# Patient Record
Sex: Female | Born: 1950 | Race: White | Hispanic: No | Marital: Married | Smoking: Current every day smoker
Health system: Southern US, Community
[De-identification: ages and names within clinical notes are randomized; demographics above are authoritative.]

## PROBLEM LIST (undated history)

## (undated) HISTORY — PX: MASTECTOMY: SHX3

---
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
## 1973-08-14 HISTORY — PX: CHOLECYSTECTOMY: SHX55

## 1994-08-14 HISTORY — PX: CARPAL TUNNEL RELEASE: SHX101

## 1999-05-12 ENCOUNTER — Other Ambulatory Visit: Admission: RE | Admit: 1999-05-12 | Discharge: 1999-05-12 | Payer: Self-pay | Admitting: *Deleted

## 1999-05-12 ENCOUNTER — Encounter (INDEPENDENT_AMBULATORY_CARE_PROVIDER_SITE_OTHER): Payer: Self-pay

## 1999-06-24 ENCOUNTER — Other Ambulatory Visit: Admission: RE | Admit: 1999-06-24 | Discharge: 1999-06-24 | Payer: Self-pay | Admitting: *Deleted

## 1999-07-25 ENCOUNTER — Other Ambulatory Visit: Admission: RE | Admit: 1999-07-25 | Discharge: 1999-07-25 | Payer: Self-pay | Admitting: *Deleted

## 1999-07-25 ENCOUNTER — Encounter (INDEPENDENT_AMBULATORY_CARE_PROVIDER_SITE_OTHER): Payer: Self-pay | Admitting: Specialist

## 2001-10-30 ENCOUNTER — Other Ambulatory Visit: Admission: RE | Admit: 2001-10-30 | Discharge: 2001-10-30 | Payer: Self-pay | Admitting: *Deleted

## 2003-11-27 ENCOUNTER — Encounter: Admission: RE | Admit: 2003-11-27 | Discharge: 2003-11-27 | Payer: Self-pay | Admitting: Family Medicine

## 2008-12-30 ENCOUNTER — Other Ambulatory Visit: Admission: RE | Admit: 2008-12-30 | Discharge: 2008-12-30 | Payer: Self-pay | Admitting: Family Medicine

## 2013-06-01 ENCOUNTER — Emergency Department (INDEPENDENT_AMBULATORY_CARE_PROVIDER_SITE_OTHER)
Admission: EM | Admit: 2013-06-01 | Discharge: 2013-06-01 | Disposition: A | Discharge status: Home / Self Care | Payer: Federal, State, Local not specified - PPO | Source: Home / Self Care | Attending: Emergency Medicine | Admitting: Emergency Medicine

## 2013-06-01 ENCOUNTER — Encounter (HOSPITAL_COMMUNITY): Payer: Self-pay | Admitting: Emergency Medicine

## 2013-06-01 ENCOUNTER — Emergency Department (INDEPENDENT_AMBULATORY_CARE_PROVIDER_SITE_OTHER): Payer: Federal, State, Local not specified - PPO

## 2013-06-01 DIAGNOSIS — S82843A Displaced bimalleolar fracture of unspecified lower leg, initial encounter for closed fracture: Secondary | ICD-10-CM

## 2013-06-01 DIAGNOSIS — S82841A Displaced bimalleolar fracture of right lower leg, initial encounter for closed fracture: Secondary | ICD-10-CM

## 2013-06-01 MED ORDER — HYDROCODONE-ACETAMINOPHEN 5-325 MG PO TABS
1.0000 | ORAL_TABLET | Freq: Once | ORAL | Status: AC
  Administered 2013-06-01: 1 via ORAL

## 2013-06-01 MED ORDER — HYDROCODONE-ACETAMINOPHEN 5-325 MG PO TABS
ORAL_TABLET | ORAL | Status: AC
  Filled 2013-06-01: qty 1

## 2013-06-01 MED ORDER — HYDROCODONE-ACETAMINOPHEN 5-325 MG PO TABS: 1.0000 | ORAL_TABLET | ORAL | Status: DC | PRN

## 2013-06-01 NOTE — Progress Notes (Signed)
Orthopedic Tech Progress Note Patient Details:  Shari Frederick [birth year] [id number]  Ortho Devices Type of Ortho Device: Ace wrap;Stirrup splint;Post (short leg) splint Ortho Device/Splint Location: RLE Ortho Device/Splint Interventions: Ordered;Application   Jennye Moccasin 06/01/2013, 8:54 PM

## 2013-06-01 NOTE — ED Notes (Addendum)
Patient complains of right ankle pain after tripping over a root about an hour ago. States she is unable to put weight on right foot.

## 2013-06-01 NOTE — ED Provider Notes (Signed)
CSN: [id number]     Arrival date & time 06/01/13  1819 History   First MD Initiated Contact with Patient 06/01/13 1855     Chief Complaint  Patient presents with  . Ankle Pain   (Consider location/radiation/quality/duration/timing/severity/associated sxs/prior Treatment) HPI Comments: 62 year old female presents complaining of ankle pain after sustaining an injury to her right ankle. She was walking in her yard when she tripped and fell. She heard a loud pop in her ankle and had immediate pain and swelling in the ankle. Now, she has pain and swelling in the lateral ankle in has not been able to bear weight since this happened. She denies any numbness in the foot distal to this and denies any other injuries.   History reviewed. No pertinent past medical history. Past Surgical History  Procedure Laterality Date  . Mastectomy Bilateral    No family history on file. History  Substance Use Topics  . Smoking status: Never Smoker   . Smokeless tobacco: Not on file  . Alcohol Use: No   OB History   Grav Para Term Preterm Abortions TAB SAB Ect Mult Living                 Review of Systems  Constitutional: Negative for fever and chills.  Eyes: Negative for visual disturbance.  Respiratory: Negative for cough and shortness of breath.   Cardiovascular: Negative for chest pain, palpitations and leg swelling.  Gastrointestinal: Negative for nausea, vomiting and abdominal pain.  Endocrine: Negative for polydipsia and polyuria.  Genitourinary: Negative for dysuria, urgency and frequency.  Musculoskeletal: Positive for arthralgias.       See history of present illness  Skin: Negative for rash.  Neurological: Negative for dizziness, weakness and light-headedness.    Allergies  Review of patient's allergies indicates no known allergies.  Home Medications   Current Outpatient Rx  Name  Route  Sig  Dispense  Refill  . escitalopram (LEXAPRO) 20 MG tablet   Oral   Take 40 mg by mouth  daily.         Marland Kitchen HYDROcodone-acetaminophen (NORCO/VICODIN) 5-325 MG per tablet   Oral   Take 1 tablet by mouth every 4 (four) hours as needed for pain.   20 tablet   0    BP 126/76  Pulse 99  Temp(Src) 98.2 F (36.8 C) (Oral)  Resp 18  SpO2 94% Physical Exam  Nursing note and vitals reviewed. Constitutional: She is oriented to person, place, and time. Vital signs are normal. She appears well-developed and well-nourished. No distress.  HENT:  Head: Normocephalic and atraumatic.  Pulmonary/Chest: Effort normal. No respiratory distress.  Musculoskeletal:       Right ankle: She exhibits decreased range of motion, swelling (over lateral malleolus) and ecchymosis. She exhibits no deformity. Tenderness. Lateral malleolus and medial malleolus tenderness found. No AITFL, no CF ligament, no posterior TFL, no head of 5th metatarsal and no proximal fibula tenderness found. Achilles tendon normal.  Neurological: She is alert and oriented to person, place, and time. She has normal strength. Coordination normal.  Skin: Skin is warm and dry. No rash noted. She is not diaphoretic.  Psychiatric: She has a normal mood and affect. Judgment normal.    ED Course  Procedures (including critical care time) Labs Review Labs Reviewed - No data to display Imaging Review Dg Ankle Complete Right  06/01/2013   CLINICAL DATA:  Ankle pain, swelling.  Fall.  EXAM: RIGHT ANKLE - COMPLETE 3+ VIEW  COMPARISON:  None.  FINDINGS: Fractures are noted through the medial and lateral malleoli. Diffuse soft tissue swelling. Fractures are minimally displaced. Ankle mortise appears intact.  IMPRESSION: Medial and lateral malleolar fractures.   Electronically Signed   By: Charlett Nose M.D.   On: 06/01/2013 20:10      MDM   1. Bimalleolar fracture, right, closed, initial encounter    Discussed with Dr. Eulah Pont, orthopedic surgeon on call. We will place the patient in a posterior leg splint, nonweightbearing, and she  will followup with his office tomorrow morning.  Meds ordered this encounter  Medications  . HYDROcodone-acetaminophen (NORCO/VICODIN) 5-325 MG per tablet    Sig: Take 1 tablet by mouth every 4 (four) hours as needed for pain.    Dispense:  20 tablet    Refill:  0    Order Specific Question:  Supervising Provider    Answer:  Lorenz Coaster, DAVID C [id number]  . HYDROcodone-acetaminophen (NORCO/VICODIN) 5-325 MG per tablet 1 tablet    Sig:        Graylon Good, PA-C 06/01/13 2036

## 2013-06-01 NOTE — ED Provider Notes (Signed)
Medical screening examination/treatment/procedure(s) were performed by non-physician practitioner and as supervising physician I was immediately available for consultation/collaboration.  Kohei Antonellis, M.D.  Dunia Pringle C Jerami Tammen, MD 06/01/13 2156 

## 2014-10-06 DIAGNOSIS — M810 Age-related osteoporosis without current pathological fracture: Secondary | ICD-10-CM | POA: Diagnosis not present

## 2014-10-06 DIAGNOSIS — Z23 Encounter for immunization: Secondary | ICD-10-CM | POA: Diagnosis not present

## 2014-10-06 DIAGNOSIS — F419 Anxiety disorder, unspecified: Secondary | ICD-10-CM | POA: Diagnosis not present

## 2014-10-06 DIAGNOSIS — F17210 Nicotine dependence, cigarettes, uncomplicated: Secondary | ICD-10-CM | POA: Diagnosis not present

## 2014-10-06 DIAGNOSIS — Z853 Personal history of malignant neoplasm of breast: Secondary | ICD-10-CM | POA: Diagnosis not present

## 2014-10-06 DIAGNOSIS — E559 Vitamin D deficiency, unspecified: Secondary | ICD-10-CM | POA: Diagnosis not present

## 2015-01-22 DIAGNOSIS — H0015 Chalazion left lower eyelid: Secondary | ICD-10-CM | POA: Diagnosis not present

## 2015-01-28 DIAGNOSIS — H0015 Chalazion left lower eyelid: Secondary | ICD-10-CM | POA: Diagnosis not present

## 2015-07-01 DIAGNOSIS — Z23 Encounter for immunization: Secondary | ICD-10-CM | POA: Diagnosis not present

## 2016-01-07 ENCOUNTER — Encounter (HOSPITAL_COMMUNITY): Payer: Self-pay | Admitting: Emergency Medicine

## 2016-01-07 ENCOUNTER — Ambulatory Visit (HOSPITAL_COMMUNITY)
Admission: EM | Admit: 2016-01-07 | Discharge: 2016-01-07 | Disposition: A | Discharge status: Home / Self Care | Payer: Medicare Other | Attending: Emergency Medicine | Admitting: Emergency Medicine

## 2016-01-07 ENCOUNTER — Ambulatory Visit (INDEPENDENT_AMBULATORY_CARE_PROVIDER_SITE_OTHER): Payer: Medicare Other

## 2016-01-07 DIAGNOSIS — S8265XA Nondisplaced fracture of lateral malleolus of left fibula, initial encounter for closed fracture: Secondary | ICD-10-CM | POA: Diagnosis not present

## 2016-01-07 DIAGNOSIS — S8262XA Displaced fracture of lateral malleolus of left fibula, initial encounter for closed fracture: Secondary | ICD-10-CM

## 2016-01-07 MED ORDER — HYDROCODONE-ACETAMINOPHEN 5-325 MG PO TABS: 1.0000 | ORAL_TABLET | Freq: Four times a day (QID) | ORAL | Status: DC | PRN

## 2016-01-07 NOTE — Discharge Instructions (Signed)
You have a transverse, nondisplaced fracture of your lateral malleolus. This will heal well without surgery. You will need to wear the Cam Walker boot for 4-6 weeks. Use the crutches if you have pain with walking. You'll need a follow-up x-ray in 10-14 days to make sure the bones are still aligned appropriately. If your primary care doctor is comfortable following this, you can follow-up with her. Otherwise, follow-up with Dr. Thurston HoleWainer.

## 2016-01-07 NOTE — ED Notes (Signed)
Pt reports she twisted left ankle yest night while walking her dog Reports dog pulled her while chasing another animal Brought back in wheel chair.... Sx include swelling and pain when bearing wt A&O x4... No acute distress.

## 2016-01-07 NOTE — ED Provider Notes (Signed)
CSN: [id number]     Arrival date & time 01/07/16  1309 History   First MD Initiated Contact with Patient 01/07/16 1327     Chief Complaint  Patient presents with  . Ankle Injury   (Consider location/radiation/quality/duration/timing/severity/associated sxs/prior Treatment) HPI  He is a 65 year old woman here for evaluation of left ankle injury. She states last night around 6 PM she was walking her dog when the dog visit unexpectedly causing her to roll her ankle. She was initially able to walk on it. She states that last night she kept it propped up, wrapped, and iced.  When she woke up this morning, the lateral ankle was swollen and she has had extreme difficulty bearing weight today.  History reviewed. No pertinent past medical history. Past Surgical History  Procedure Laterality Date  . Mastectomy Bilateral    No family history on file. Social History  Substance Use Topics  . Smoking status: Never Smoker   . Smokeless tobacco: None  . Alcohol Use: No   OB History    No data available     Review of Systems As in history of present illness Allergies  Review of patient's allergies indicates no known allergies.  Home Medications   Prior to Admission medications   Medication Sig Start Date End Date Taking? Authorizing Provider  escitalopram (LEXAPRO) 20 MG tablet Take 40 mg by mouth daily.   Yes Historical Provider, MD  HYDROcodone-acetaminophen (NORCO) 5-325 MG tablet Take 1 tablet by mouth every 6 (six) hours as needed for moderate pain. 01/07/16   Charm RingsErin J Christell Steinmiller, MD   Meds Ordered and Administered this Visit  Medications - No data to display  BP 154/80 mmHg  Pulse 104  Temp(Src) 98.1 F (36.7 C) (Oral)  Resp 16  SpO2 95% No data found.   Physical Exam  Constitutional: She is oriented to person, place, and time. She appears well-developed and well-nourished. No distress.  Cardiovascular: Normal rate.   Pulmonary/Chest: Effort normal.  Musculoskeletal:  Left ankle:  No erythema or bruising. She does have moderate swelling around the lateral malleolus. She is tender along the anterior portion of the lateral malleolus. 5 out of 5 strength in the ankle. 2+ DP pulse. No tenderness of the fifth metatarsal. No medial malleolar tenderness.  Neurological: She is alert and oriented to person, place, and time.    ED Course  Procedures (including critical care time)  Labs Review Labs Reviewed - No data to display  Imaging Review Dg Ankle Complete Left  01/07/2016  CLINICAL DATA:  Walking dog last night, twisted ankle EXAM: LEFT ANKLE COMPLETE - 3+ VIEW COMPARISON:  None. FINDINGS: There is an acute transverse nondisplaced fracture of the left lateral malleolus. There is no other fracture or dislocation. There is a plantar calcaneal spur. There is no evidence of arthropathy or other focal bone abnormality. Soft tissues are unremarkable. IMPRESSION: 1. Acute transverse nondisplaced fracture of the left lateral malleolus. Electronically Signed   By: Elige KoHetal  Patel   On: 01/07/2016 13:50     MDM   1. Lateral malleolar fracture, left, closed, initial encounter    Fracture appears to be very stable. We'll place in SYSCOCam Walker boot. She does have crutches at home that she can use. Prescription given for hydrocodone to use as needed for pain. Discussed that if her primary care doctor is comfortable following this, they can follow-up with her. Otherwise, they will need to follow-up with Dr. Thurston HoleWainer who did her prior ankle surgery. Will need  follow-up x-ray in 10-14 days.     Charm Rings, MD 01/07/16 [phone]

## 2016-04-11 DIAGNOSIS — F419 Anxiety disorder, unspecified: Secondary | ICD-10-CM | POA: Diagnosis not present

## 2016-04-11 DIAGNOSIS — M810 Age-related osteoporosis without current pathological fracture: Secondary | ICD-10-CM | POA: Diagnosis not present

## 2016-04-11 DIAGNOSIS — F17210 Nicotine dependence, cigarettes, uncomplicated: Secondary | ICD-10-CM | POA: Diagnosis not present

## 2016-04-11 DIAGNOSIS — Z853 Personal history of malignant neoplasm of breast: Secondary | ICD-10-CM | POA: Diagnosis not present

## 2016-04-11 DIAGNOSIS — E559 Vitamin D deficiency, unspecified: Secondary | ICD-10-CM | POA: Diagnosis not present

## 2016-04-11 DIAGNOSIS — Z23 Encounter for immunization: Secondary | ICD-10-CM | POA: Diagnosis not present

## 2016-12-04 DIAGNOSIS — F419 Anxiety disorder, unspecified: Secondary | ICD-10-CM | POA: Diagnosis not present

## 2016-12-04 DIAGNOSIS — E559 Vitamin D deficiency, unspecified: Secondary | ICD-10-CM | POA: Diagnosis not present

## 2016-12-04 DIAGNOSIS — M810 Age-related osteoporosis without current pathological fracture: Secondary | ICD-10-CM | POA: Diagnosis not present

## 2016-12-04 DIAGNOSIS — Z853 Personal history of malignant neoplasm of breast: Secondary | ICD-10-CM | POA: Diagnosis not present

## 2016-12-04 DIAGNOSIS — Z1211 Encounter for screening for malignant neoplasm of colon: Secondary | ICD-10-CM | POA: Diagnosis not present

## 2016-12-04 DIAGNOSIS — Z1159 Encounter for screening for other viral diseases: Secondary | ICD-10-CM | POA: Diagnosis not present

## 2016-12-04 DIAGNOSIS — Z0000 Encounter for general adult medical examination without abnormal findings: Secondary | ICD-10-CM | POA: Diagnosis not present

## 2016-12-04 DIAGNOSIS — F17210 Nicotine dependence, cigarettes, uncomplicated: Secondary | ICD-10-CM | POA: Diagnosis not present

## 2017-07-11 IMAGING — DX DG ANKLE COMPLETE 3+V*L*
3 series · 3 of 3 positions shown · non-contrast
Comparison: None.

CLINICAL DATA: Walking dog last night, twisted ankle

EXAM:
LEFT ANKLE COMPLETE - 3+ VIEW

[ankle ap]
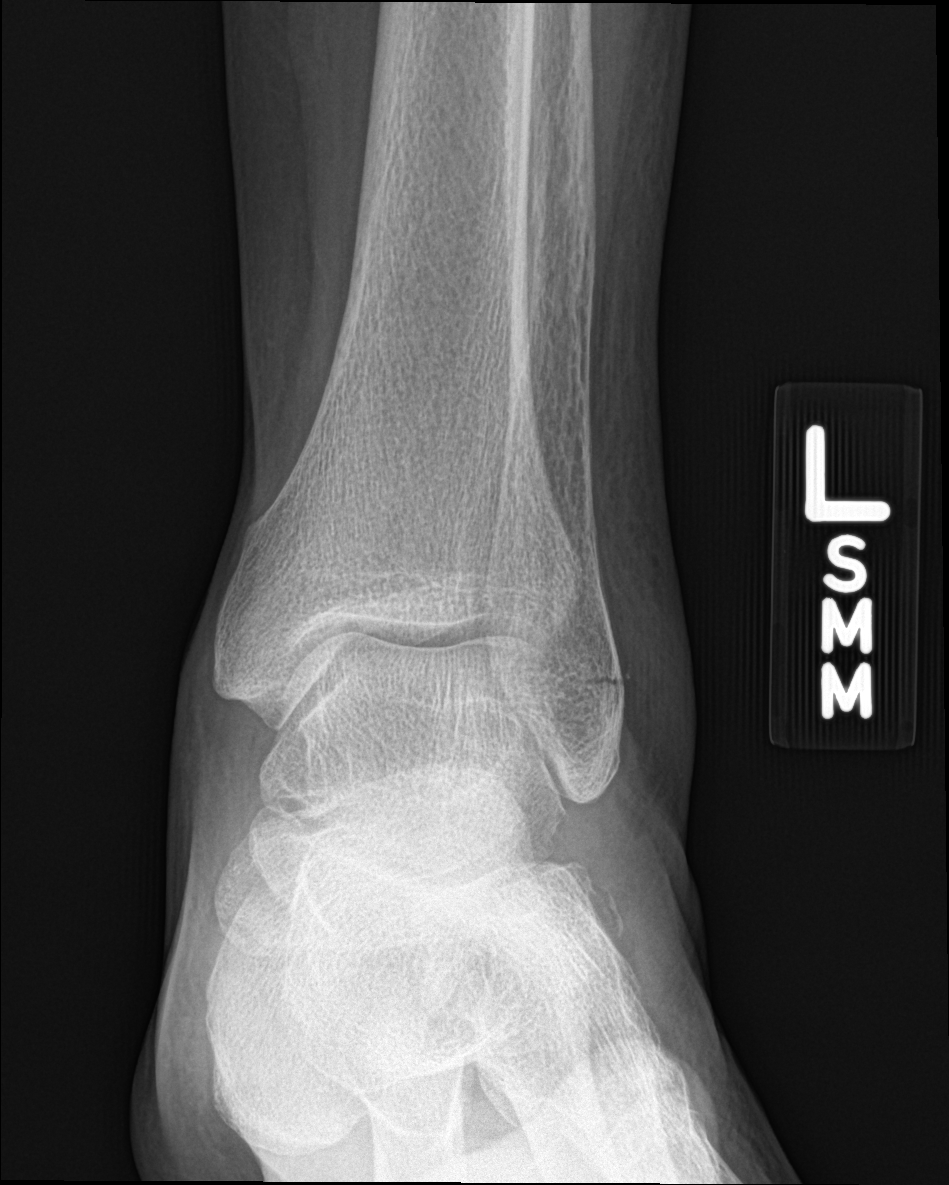

[ankle obl]
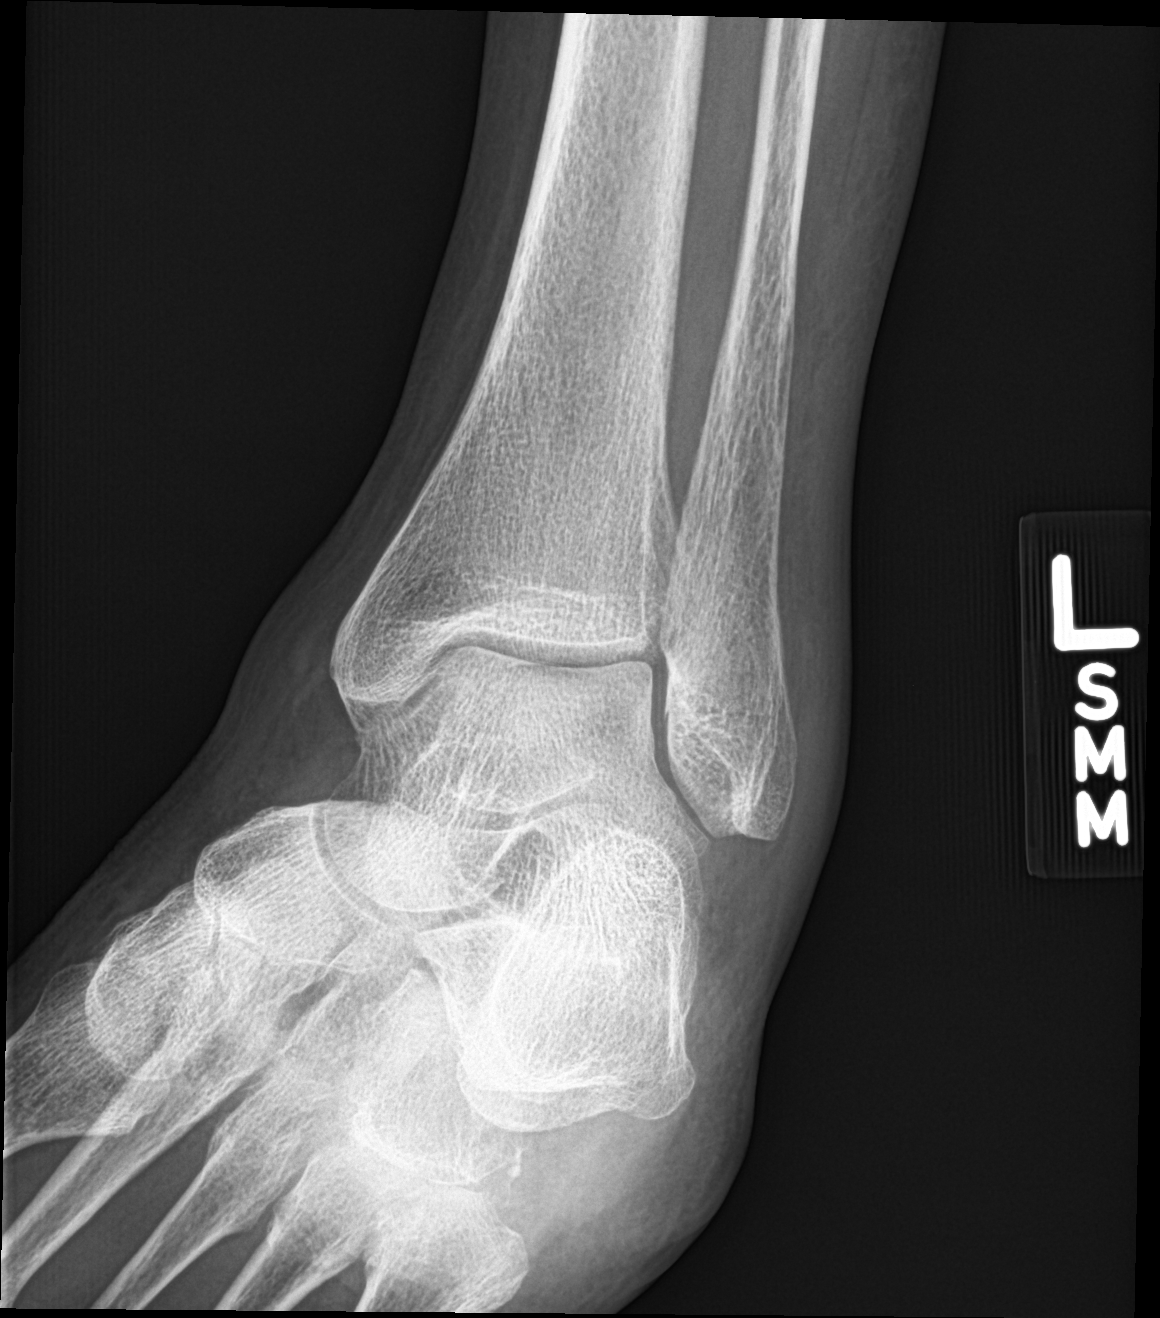

[ankle lat]
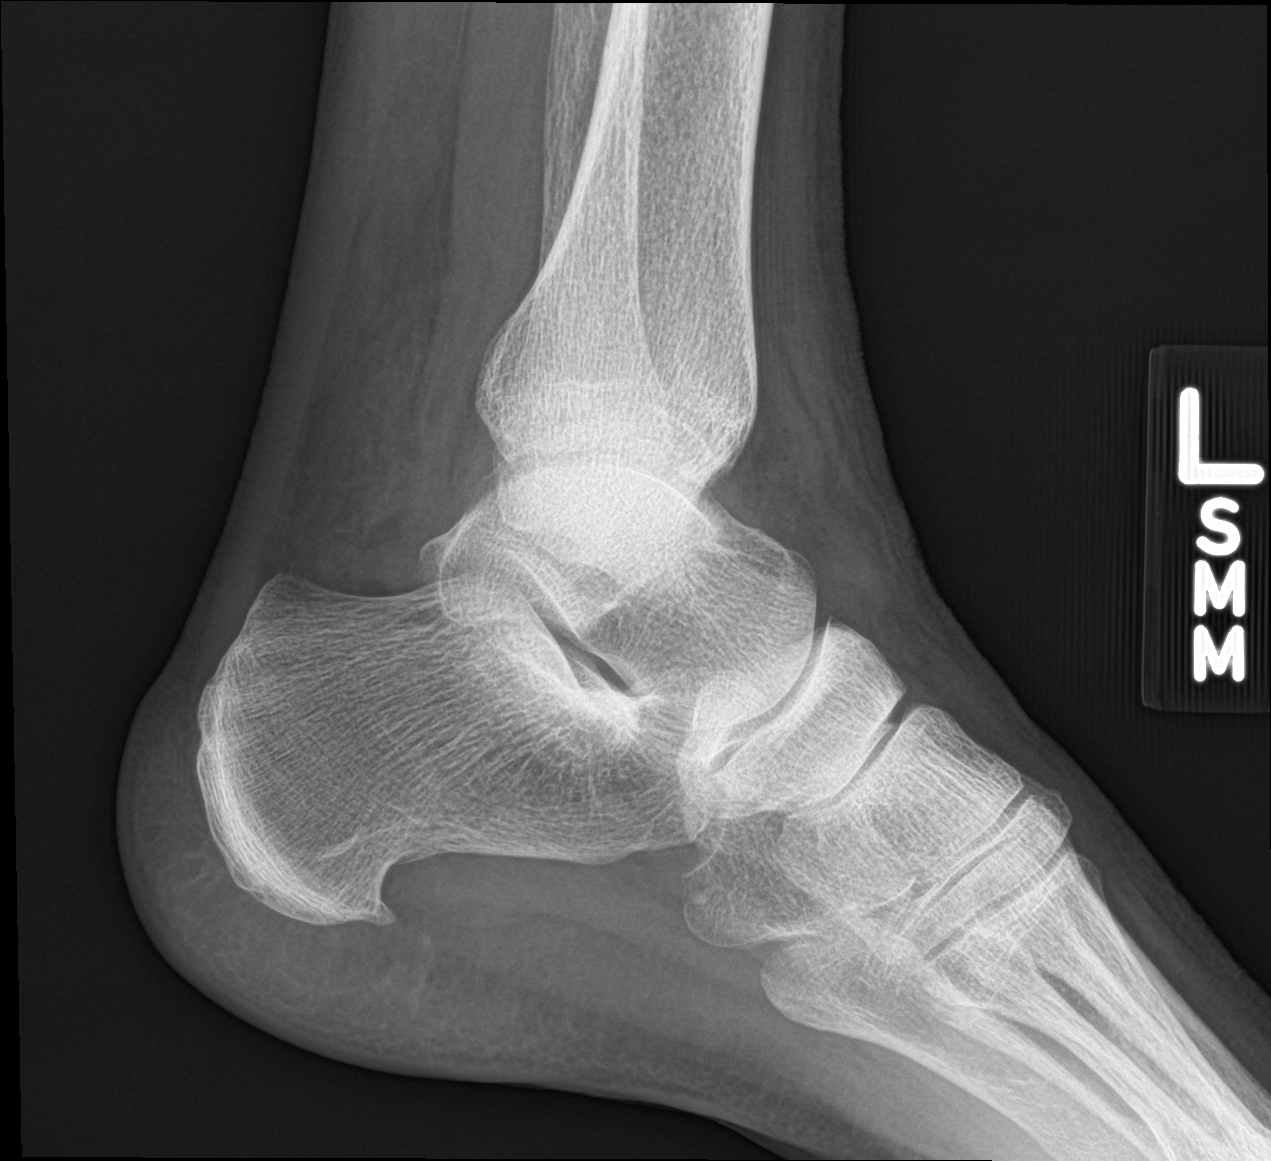

[3 of 3 positions shown; findings below may reference images not displayed]

FINDINGS: There is an acute transverse nondisplaced fracture of the left
lateral malleolus. There is no other fracture or dislocation. There
is a plantar calcaneal spur. There is no evidence of arthropathy or
other focal bone abnormality. Soft tissues are unremarkable.
IMPRESSION: 1. Acute transverse nondisplaced fracture of the left lateral
malleolus.

## 2017-11-23 DIAGNOSIS — M810 Age-related osteoporosis without current pathological fracture: Secondary | ICD-10-CM | POA: Diagnosis not present

## 2017-11-23 DIAGNOSIS — F411 Generalized anxiety disorder: Secondary | ICD-10-CM | POA: Diagnosis not present

## 2017-11-23 DIAGNOSIS — Z6822 Body mass index (BMI) 22.0-22.9, adult: Secondary | ICD-10-CM | POA: Diagnosis not present

## 2017-11-23 DIAGNOSIS — F17210 Nicotine dependence, cigarettes, uncomplicated: Secondary | ICD-10-CM | POA: Diagnosis not present

## 2019-07-04 DIAGNOSIS — F17210 Nicotine dependence, cigarettes, uncomplicated: Secondary | ICD-10-CM | POA: Diagnosis not present

## 2019-07-04 DIAGNOSIS — F411 Generalized anxiety disorder: Secondary | ICD-10-CM | POA: Diagnosis not present

## 2019-07-04 DIAGNOSIS — Z0000 Encounter for general adult medical examination without abnormal findings: Secondary | ICD-10-CM | POA: Diagnosis not present

## 2019-07-04 DIAGNOSIS — E559 Vitamin D deficiency, unspecified: Secondary | ICD-10-CM | POA: Diagnosis not present

## 2019-07-04 DIAGNOSIS — Z853 Personal history of malignant neoplasm of breast: Secondary | ICD-10-CM | POA: Diagnosis not present

## 2019-07-04 DIAGNOSIS — M810 Age-related osteoporosis without current pathological fracture: Secondary | ICD-10-CM | POA: Diagnosis not present

## 2020-08-24 DIAGNOSIS — E559 Vitamin D deficiency, unspecified: Secondary | ICD-10-CM | POA: Diagnosis not present

## 2020-08-24 DIAGNOSIS — F411 Generalized anxiety disorder: Secondary | ICD-10-CM | POA: Diagnosis not present

## 2020-08-24 DIAGNOSIS — Z6823 Body mass index (BMI) 23.0-23.9, adult: Secondary | ICD-10-CM | POA: Diagnosis not present

## 2020-08-24 DIAGNOSIS — R202 Paresthesia of skin: Secondary | ICD-10-CM | POA: Diagnosis not present

## 2020-08-24 DIAGNOSIS — F17210 Nicotine dependence, cigarettes, uncomplicated: Secondary | ICD-10-CM | POA: Diagnosis not present

## 2021-08-26 DIAGNOSIS — R202 Paresthesia of skin: Secondary | ICD-10-CM | POA: Diagnosis not present

## 2021-08-26 DIAGNOSIS — F411 Generalized anxiety disorder: Secondary | ICD-10-CM | POA: Diagnosis not present

## 2021-08-26 DIAGNOSIS — E559 Vitamin D deficiency, unspecified: Secondary | ICD-10-CM | POA: Diagnosis not present

## 2021-08-26 DIAGNOSIS — F17210 Nicotine dependence, cigarettes, uncomplicated: Secondary | ICD-10-CM | POA: Diagnosis not present

## 2022-10-25 DIAGNOSIS — F17210 Nicotine dependence, cigarettes, uncomplicated: Secondary | ICD-10-CM | POA: Diagnosis not present

## 2022-10-25 DIAGNOSIS — Z853 Personal history of malignant neoplasm of breast: Secondary | ICD-10-CM | POA: Diagnosis not present

## 2022-10-25 DIAGNOSIS — Z23 Encounter for immunization: Secondary | ICD-10-CM | POA: Diagnosis not present

## 2022-10-25 DIAGNOSIS — E785 Hyperlipidemia, unspecified: Secondary | ICD-10-CM | POA: Diagnosis not present

## 2022-10-25 DIAGNOSIS — R202 Paresthesia of skin: Secondary | ICD-10-CM | POA: Diagnosis not present

## 2022-10-25 DIAGNOSIS — M810 Age-related osteoporosis without current pathological fracture: Secondary | ICD-10-CM | POA: Diagnosis not present

## 2022-10-25 DIAGNOSIS — E559 Vitamin D deficiency, unspecified: Secondary | ICD-10-CM | POA: Diagnosis not present

## 2022-10-25 DIAGNOSIS — F411 Generalized anxiety disorder: Secondary | ICD-10-CM | POA: Diagnosis not present

## 2022-10-25 DIAGNOSIS — J449 Chronic obstructive pulmonary disease, unspecified: Secondary | ICD-10-CM | POA: Diagnosis not present

## 2024-02-28 DIAGNOSIS — F17210 Nicotine dependence, cigarettes, uncomplicated: Secondary | ICD-10-CM | POA: Diagnosis not present

## 2024-02-28 DIAGNOSIS — E559 Vitamin D deficiency, unspecified: Secondary | ICD-10-CM | POA: Diagnosis not present

## 2024-02-28 DIAGNOSIS — Z6821 Body mass index (BMI) 21.0-21.9, adult: Secondary | ICD-10-CM | POA: Diagnosis not present

## 2024-02-28 DIAGNOSIS — F411 Generalized anxiety disorder: Secondary | ICD-10-CM | POA: Diagnosis not present

## 2024-02-28 DIAGNOSIS — Z13220 Encounter for screening for lipoid disorders: Secondary | ICD-10-CM | POA: Diagnosis not present

## 2024-02-28 DIAGNOSIS — Z136 Encounter for screening for cardiovascular disorders: Secondary | ICD-10-CM | POA: Diagnosis not present

## 2024-02-28 DIAGNOSIS — R7989 Other specified abnormal findings of blood chemistry: Secondary | ICD-10-CM | POA: Diagnosis not present

## 2024-02-28 DIAGNOSIS — G629 Polyneuropathy, unspecified: Secondary | ICD-10-CM | POA: Diagnosis not present

## 2024-02-28 DIAGNOSIS — D751 Secondary polycythemia: Secondary | ICD-10-CM | POA: Diagnosis not present

## 2024-05-09 DIAGNOSIS — D751 Secondary polycythemia: Secondary | ICD-10-CM | POA: Diagnosis not present

## 2024-05-09 DIAGNOSIS — G629 Polyneuropathy, unspecified: Secondary | ICD-10-CM | POA: Diagnosis not present

## 2024-05-09 DIAGNOSIS — G479 Sleep disorder, unspecified: Secondary | ICD-10-CM | POA: Diagnosis not present

## 2024-05-09 DIAGNOSIS — Z23 Encounter for immunization: Secondary | ICD-10-CM | POA: Diagnosis not present

## 2024-05-09 DIAGNOSIS — F411 Generalized anxiety disorder: Secondary | ICD-10-CM | POA: Diagnosis not present

## 2024-05-09 DIAGNOSIS — Z6821 Body mass index (BMI) 21.0-21.9, adult: Secondary | ICD-10-CM | POA: Diagnosis not present

## 2024-05-09 DIAGNOSIS — M545 Low back pain, unspecified: Secondary | ICD-10-CM | POA: Diagnosis not present

## 2024-07-01 ENCOUNTER — Encounter: Payer: Self-pay | Admitting: Diagnostic Neuroimaging

## 2024-07-01 ENCOUNTER — Ambulatory Visit (INDEPENDENT_AMBULATORY_CARE_PROVIDER_SITE_OTHER): Admitting: Diagnostic Neuroimaging

## 2024-07-01 VITALS — BP 142/74 | HR 87 | Ht 62.0 in | Wt 113.0 lb

## 2024-07-01 DIAGNOSIS — G5793 Unspecified mononeuropathy of bilateral lower limbs: Secondary | ICD-10-CM | POA: Diagnosis not present

## 2024-07-01 MED ORDER — GABAPENTIN 300 MG PO CAPS: 300.0000 mg | ORAL_CAPSULE | Freq: Three times a day (TID) | ORAL | 6 refills | Status: AC

## 2024-07-01 NOTE — Patient Instructions (Signed)
  NEUROPATHY PAIN (feet, toes; since ~2023) - check neuropathy labs; check vascular arterial ultrasound - increase gabapentin up to 300mg  three times a day - caution with balance, walking, driving

## 2024-07-01 NOTE — Progress Notes (Signed)
 [facility]  PATIENT: Shari Frederick DOB: 13-Apr-1951  REFERRING CLINICIAN: Aisha Harvey, MD HISTORY FROM: patient  REASON FOR VISIT: new consult   HISTORICAL  CHIEF COMPLAINT:  Chief Complaint  Patient presents with   RM 6    Consult: peripheral polyneuropathy; BLE, off and on for several years; no diabetes hx; alone    HISTORY OF PRESENT ILLNESS:   73 year old female with hypertension, hyperlipidemia, chronic tobacco use, here for evaluation of painful feet.  Symptoms started about 2 years ago with pain, pins-and-needles, tingling sensation of the toes and bottom of the feet.  This is progressively worsened over the past year.  Sometimes she feels coldness in her feet.  No problems in fingers or arms.  No neck pain.  Some intermittent low back pain.   REVIEW OF SYSTEMS: Full 14 system review of systems performed and negative with exception of: As per HPI.  ALLERGIES: No Known Allergies  HOME MEDICATIONS: Outpatient Medications Prior to Visit  Medication Sig Dispense Refill   escitalopram (LEXAPRO) 5 MG tablet Take 10 mg by mouth daily.     LORazepam (ATIVAN) 0.5 MG tablet Take 0.5 mg by mouth daily as needed.     Vitamin D, Ergocalciferol, (DRISDOL) 1.25 MG (50000 UNIT) CAPS capsule Take 50,000 Units by mouth once a week.     DULoxetine (CYMBALTA) 30 MG capsule Take 30 mg by mouth 2 (two) times daily.     gabapentin (NEURONTIN) 100 MG capsule Take 300 mg by mouth at bedtime.     escitalopram (LEXAPRO) 20 MG tablet Take 40 mg by mouth daily.     HYDROcodone -acetaminophen  (NORCO) 5-325 MG tablet Take 1 tablet by mouth every 6 (six) hours as needed for moderate pain. 15 tablet 0   No facility-administered medications prior to visit.    PAST MEDICAL HISTORY: No past medical history on file.  PAST SURGICAL HISTORY: Past Surgical History:  Procedure Laterality Date   APPENDECTOMY  1962   CARPAL TUNNEL RELEASE Left 1996   CHOLECYSTECTOMY  1975    MASTECTOMY Bilateral    TONSILLECTOMY  1956    FAMILY HISTORY: No family history on file.  SOCIAL HISTORY: Social History   Socioeconomic History   Marital status: Married    Spouse name: Not on file   Number of children: Not on file   Years of education: Not on file   Highest education level: Not on file  Occupational History   Not on file  Tobacco Use   Smoking status: Every Day    Types: Cigarettes   Smokeless tobacco: Never  Vaping Use   Vaping status: Never Used  Substance and Sexual Activity   Alcohol use: No   Drug use: No   Sexual activity: Never  Other Topics Concern   Not on file  Social History Narrative   Not on file   Social Drivers of Health   Financial Resource Strain: Not on file  Food Insecurity: Not on file  Transportation Needs: Not on file  Physical Activity: Not on file  Stress: Not on file  Social Connections: Not on file  Intimate Partner Violence: Not on file     PHYSICAL EXAM  GENERAL EXAM/CONSTITUTIONAL: Vitals:  Vitals:   07/01/24 1155  BP: (!) 142/74  Pulse: 87  Weight: 113 lb (51.3 kg)  Height: 5' 2 (1.575 m)   Body mass index is 20.67 kg/m. Wt Readings from Last 3 Encounters:  07/01/24 113 lb (51.3 kg)   Patient is  in no distress; well developed, nourished and groomed; neck is supple; SMELLS OF CIGARETTE SMOKE  CARDIOVASCULAR: Examination of carotid arteries is normal; no carotid bruits Regular rate and rhythm, no murmurs Examination of peripheral vascular system by observation and palpation is normal  EYES: Ophthalmoscopic exam of optic discs and posterior segments is normal; no papilledema or hemorrhages No results found.  MUSCULOSKELETAL: Gait, strength, tone, movements noted in Neurologic exam below  NEUROLOGIC: MENTAL STATUS:      No data to display         awake, alert, oriented to person, place and time recent and remote memory intact normal attention and concentration language fluent,  comprehension intact, naming intact fund of knowledge appropriate  CRANIAL NERVE:  2nd - no papilledema on fundoscopic exam 2nd, 3rd, 4th, 6th - pupils equal and reactive to light, visual fields full to confrontation, extraocular muscles intact, no nystagmus 5th - facial sensation symmetric 7th - facial strength symmetric 8th - hearing intact 9th - palate elevates symmetrically, uvula midline 11th - shoulder shrug symmetric 12th - tongue protrusion midline  MOTOR:  normal bulk and tone, full strength in the BUE, BLE  SENSORY:  normal and symmetric to light touch, temperature, vibration; EXCEPT SLIGHTLY DECR IN FEET, ANKLES  COORDINATION:  finger-nose-finger, fine finger movements normal  REFLEXES:  deep tendon reflexes 1+ and symmetric  GAIT/STATION:  narrow based gait; UNSTEADY; USING CANE     DIAGNOSTIC DATA (LABS, IMAGING, TESTING) - I reviewed patient records, labs, notes, testing and imaging myself where available.  No results found for: WBC, HGB, HCT, MCV, PLT No results found for: NA, K, CL, CO2, GLUCOSE, BUN, CREATININE, CALCIUM, PROT, ALBUMIN, AST, ALT, ALKPHOS, BILITOT, GFRNONAA, GFRAA No results found for: CHOL, HDL, LDLCALC, LDLDIRECT, TRIG, CHOLHDL No results found for: YHAJ8R No results found for: VITAMINB12 No results found for: TSH    ASSESSMENT AND PLAN  73 y.o. year old female here with:   Dx:  1. Neuropathic pain of both feet      PLAN:  NEUROPATHY PAIN (feet, toes; since ~2023) - check neuropathy labs; check vascular arterial ultrasound - increase gabapentin up to 300mg  three times a day - caution with balance, walking, driving   Orders Placed This Encounter  Procedures   SPEP with IFE   ANA w/Reflex   SSA, SSB   Vitamin B1   Vitamin B6   Anti-HU, Anti-RI, Anti-YO IFA   ANCA Profile   VAS US  ABI WITH/WO TBI   Meds ordered this encounter  Medications   gabapentin  (NEURONTIN) 300 MG capsule    Sig: Take 1 capsule (300 mg total) by mouth 3 (three) times daily.    Dispense:  90 capsule    Refill:  6   Return for pending test results, pending if symptoms worsen or fail to improve.    EDUARD FABIENE HANLON, MD 07/01/2024, 12:28 PM Certified in Neurology, Neurophysiology and Neuroimaging  [facility] [address], [address] [place], [place] [address] [phone]

## 2024-07-10 LAB — ENA+DNA/DS+ANTICH+CENTRO+FA...
Anti JO-1: <0.2 AI (ref 0.0–0.9)
Antiribosomal P Antibodies: <0.2 AI (ref 0.0–0.9)
Centromere Ab Screen: <0.2 AI (ref 0.0–0.9)
Chromatin Ab SerPl-aCnc: <0.2 AI (ref 0.0–0.9)
ENA RNP Ab: 0.4 AI (ref 0.0–0.9)
ENA SM Ab Ser-aCnc: <0.2 AI (ref 0.0–0.9)
Scleroderma (Scl-70) (ENA) Antibody, IgG: <0.2 AI (ref 0.0–0.9)
Smith/RNP Antibodies: <0.2 AI (ref 0.0–0.9)
Speckled Pattern: 1:160 {titer} — ABNORMAL HIGH
dsDNA Ab: <1 [IU]/mL (ref 0–9)

## 2024-07-10 LAB — MULTIPLE MYELOMA PANEL, SERUM
Albumin SerPl Elph-Mcnc: 3.8 g/dL (ref 2.9–4.4)
Albumin/Glob SerPl: 1.1 (ref 0.7–1.7)
Alpha 1: 0.3 g/dL (ref 0.0–0.4)
Alpha2 Glob SerPl Elph-Mcnc: 0.8 g/dL (ref 0.4–1.0)
B-Globulin SerPl Elph-Mcnc: 1.1 g/dL (ref 0.7–1.3)
Gamma Glob SerPl Elph-Mcnc: 1.4 g/dL (ref 0.4–1.8)
Globulin, Total: 3.5 g/dL (ref 2.2–3.9)
IgG (Immunoglobin G), Serum: 1478 mg/dL (ref 586–1602)
IgM (Immunoglobulin M), Srm: 34 mg/dL (ref 26–217)
Immunoglobulin A, (IgA) QN, Serum: 155 mg/dL (ref 64–422)
Total Protein: 7.3 g/dL (ref 6.0–8.5)

## 2024-07-10 LAB — SJOGREN'S SYNDROME ANTIBODS(SSA + SSB)
ENA SSA (RO) Ab: <0.2 AI (ref 0.0–0.9)
ENA SSB (LA) Ab: <0.2 AI (ref 0.0–0.9)

## 2024-07-10 LAB — ANCA PROFILE
Anti-MPO Antibodies: <0.2 U (ref 0.0–0.9)
Anti-PR3 Antibodies: <0.2 U (ref 0.0–0.9)
Atypical pANCA: <1:20 {titer}
C-ANCA: <1:20 {titer}
P-ANCA: <1:20 {titer}

## 2024-07-10 LAB — ANA W/REFLEX: ANA Titer 1: POSITIVE — AB

## 2024-07-10 LAB — ANTI-HU, ANTI-RI, ANTI-YO IFA
Anti-Hu Ab: NEGATIVE
Anti-Ri Ab: NEGATIVE
Anti-Yo Ab: NEGATIVE

## 2024-07-10 LAB — VITAMIN B1: Thiamine: 115.8 nmol/L (ref 66.5–200.0)

## 2024-07-10 LAB — VITAMIN B6: Vitamin B6: 8 ug/L (ref 3.4–65.2)

## 2024-08-04 ENCOUNTER — Ambulatory Visit (HOSPITAL_COMMUNITY)
Admission: RE | Admit: 2024-08-04 | Discharge: 2024-08-04 | Disposition: A | Discharge status: Home / Self Care | Source: Ambulatory Visit | Attending: Diagnostic Neuroimaging | Admitting: Diagnostic Neuroimaging

## 2024-08-04 DIAGNOSIS — G5793 Unspecified mononeuropathy of bilateral lower limbs: Secondary | ICD-10-CM | POA: Diagnosis not present

## 2024-08-17 ENCOUNTER — Ambulatory Visit: Payer: Self-pay | Admitting: Diagnostic Neuroimaging

## 2024-08-27 NOTE — Progress Notes (Signed)
 Attempted to call [phone] and received message that call could not be completed at this time. We have received word that there is currently a [place] [employer] outage.  Unsure if patient is affected by this.  Will try again later.

## 2024-08-28 NOTE — Telephone Encounter (Signed)
 I spoke with the patient and discussed her results as noted below by Dr Margaret. The patient's questions were answered to the best of my ability. She was offered a rheumatology referral to further explore the positive ANA. For now she wants to hold off. She will talk to her daughter-in-law however. She thanked me for the call.
